# Patient Record
Sex: Female | Born: 1995 | Race: White | Hispanic: No | Marital: Married | Smoking: Never smoker
Health system: Southern US, Community
[De-identification: ages and names within clinical notes are randomized; demographics above are authoritative.]

---

## 2018-12-21 HISTORY — PX: CHOLECYSTECTOMY: SHX55

## 2022-01-02 ENCOUNTER — Other Ambulatory Visit: Payer: Self-pay

## 2022-01-02 ENCOUNTER — Encounter: Payer: Self-pay | Admitting: Emergency Medicine

## 2022-01-02 ENCOUNTER — Ambulatory Visit
Admission: EM | Admit: 2022-01-02 | Discharge: 2022-01-02 | Disposition: A | Discharge status: Home / Self Care | Payer: BC Managed Care – PPO

## 2022-01-02 DIAGNOSIS — J309 Allergic rhinitis, unspecified: Secondary | ICD-10-CM | POA: Diagnosis not present

## 2022-01-02 DIAGNOSIS — J3089 Other allergic rhinitis: Secondary | ICD-10-CM | POA: Diagnosis not present

## 2022-01-02 MED ORDER — PREDNISONE 20 MG PO TABS: 40.0000 mg | ORAL_TABLET | Freq: Every day | ORAL | 0 refills | Status: AC

## 2022-01-02 MED ORDER — LEVOCETIRIZINE DIHYDROCHLORIDE 5 MG PO TABS
5.0000 mg | ORAL_TABLET | Freq: Every evening | ORAL | 5 refills | Status: AC
Start: 2022-01-02 — End: ?

## 2022-01-02 NOTE — ED Provider Notes (Signed)
[facility]    CSN: [id number] Arrival date & time: 01/02/22  1737      History   Chief Complaint No chief complaint on file.   HPI Rose Clark is a 26 y.o. female.   Patient presenting today with 25-month history of nasal congestion, postnasal drip, sometimes blood-streaked mucus coming from her nose.  She denies facial pain or pressure, fever, chills, sore throat, cough, chest pain, shortness of breath.  Taking Sudafed and Flonase nasal spray with mild temporary relief occasionally.  She has known history of seasonal allergies, used to take Allegra but has been off for quite some time and wants a recommendation on a new antihistamine.  No known sick contacts recently.   History reviewed. No pertinent past medical history.  There are no problems to display for this patient.   History reviewed. No pertinent surgical history.  OB History   No obstetric history on file.      Home Medications    Prior to Admission medications   Medication Sig Start Date End Date Taking? Authorizing Provider  fluticasone (FLONASE) 50 MCG/ACT nasal spray Place into both nostrils daily.   Yes [provider]  levocetirizine (XYZAL) 5 MG tablet Take 1 tablet (5 mg total) by mouth every evening. 01/02/22  Yes Volney American, PA-C  predniSONE (DELTASONE) 20 MG tablet Take 2 tablets (40 mg total) by mouth daily with breakfast. 01/02/22  Yes Volney American, PA-C  pseudoephedrine (SUDAFED) 60 MG tablet Take 60 mg by mouth every 4 (four) hours as needed for congestion.   Yes [provider]    Family History History reviewed. No pertinent family history.  Social History Social History   Tobacco Use   Smoking status: Never   Smokeless tobacco: Never  Vaping Use   Vaping Use: Never used  Substance Use Topics   Alcohol use: Never   Drug use: Never     Allergies   Patient has no known allergies.   Review of Systems Review of Systems Per  HPI  Physical Exam Triage Vital Signs ED Triage Vitals  Enc Vitals Group     BP 01/02/22 1858 112/70     Pulse Rate 01/02/22 1858 80     Resp 01/02/22 1858 14     Temp 01/02/22 1858 (!) 97.4 F (36.3 C)     Temp Source 01/02/22 1858 Oral     SpO2 01/02/22 1858 98 %     Weight --      Height --      Head Circumference --      Peak Flow --      Pain Score 01/02/22 1859 0     Pain Loc --      Pain Edu? --      Excl. in [place]? --    No data found.  Updated Vital Signs BP 112/70 (BP Location: Right Arm)    Pulse 80    Temp (!) 97.4 F (36.3 C) (Oral)    Resp 14    LMP 12/04/2021 (Approximate)    SpO2 98%   Visual Acuity Right Eye Distance:   Left Eye Distance:   Bilateral Distance:    Right Eye Near:   Left Eye Near:    Bilateral Near:     Physical Exam Vitals and nursing note reviewed.  Constitutional:      Appearance: Normal appearance.  HENT:     Head: Atraumatic.     Right Ear: Tympanic membrane and external  ear normal.     Left Ear: Tympanic membrane and external ear normal.     Nose: Rhinorrhea present.     Mouth/Throat:     Mouth: Mucous membranes are moist.     Pharynx: Posterior oropharyngeal erythema present.  Eyes:     Extraocular Movements: Extraocular movements intact.     Conjunctiva/sclera: Conjunctivae normal.  Cardiovascular:     Rate and Rhythm: Normal rate and regular rhythm.     Heart sounds: Normal heart sounds.  Pulmonary:     Effort: Pulmonary effort is normal.     Breath sounds: Normal breath sounds. No wheezing or rales.  Musculoskeletal:        General: Normal range of motion.     Cervical back: Normal range of motion and neck supple.  Skin:    General: Skin is warm and dry.  Neurological:     Mental Status: She is alert and oriented to person, place, and time.  Psychiatric:        Mood and Affect: Mood normal.        Thought Content: Thought content normal.     UC Treatments / Results  Labs (all labs ordered are listed, but  only abnormal results are displayed) Labs Reviewed - No data to display  EKG   Radiology No results found.  Procedures Procedures (including critical care time)  Medications Ordered in UC Medications - No data to display  Initial Impression / Assessment and Plan / UC Course  I have reviewed the triage vital signs and the nursing notes.  Pertinent labs & imaging results that were available during my care of the patient were reviewed by me and considered in my medical decision making (see chart for details).     Will treat with prednisone, continue nasal spray, start Xyzal for allergy control.  Discussed supportive over-the-counter medications and home care additionally.  Return for acutely worsening symptoms.  Final Clinical Impressions(s) / UC Diagnoses   Final diagnoses:  Allergic sinusitis  Seasonal allergic rhinitis due to other allergic trigger   Discharge Instructions   None    ED Prescriptions     Medication Sig Dispense Auth. Provider   levocetirizine (XYZAL) 5 MG tablet Take 1 tablet (5 mg total) by mouth every evening. 30 tablet Volney American, [place]   predniSONE (DELTASONE) 20 MG tablet Take 2 tablets (40 mg total) by mouth daily with breakfast. 10 tablet Volney American, [place]      PDMP not reviewed this encounter.   Volney American, [place] 01/02/22 1913

## 2022-01-02 NOTE — ED Triage Notes (Signed)
Nasal Congestion x 2 months.  States some of the congestion has blood mixed in with it.  Has been taking sudafed without relief.

## 2022-02-04 ENCOUNTER — Emergency Department: Payer: BC Managed Care – PPO

## 2022-02-04 ENCOUNTER — Other Ambulatory Visit: Payer: Self-pay

## 2022-02-04 ENCOUNTER — Emergency Department
Admission: EM | Admit: 2022-02-04 | Discharge: 2022-02-04 | Disposition: A | Discharge status: Home / Self Care | Payer: BC Managed Care – PPO | Attending: Student in an Organized Health Care Education/Training Program | Admitting: Student in an Organized Health Care Education/Training Program

## 2022-02-04 DIAGNOSIS — R1011 Right upper quadrant pain: Secondary | ICD-10-CM | POA: Insufficient documentation

## 2022-02-04 DIAGNOSIS — R110 Nausea: Secondary | ICD-10-CM | POA: Insufficient documentation

## 2022-02-04 DIAGNOSIS — R197 Diarrhea, unspecified: Secondary | ICD-10-CM | POA: Insufficient documentation

## 2022-02-04 LAB — COMPREHENSIVE METABOLIC PANEL
ALT: 19 U/L (ref 0–44)
AST: 19 U/L (ref 15–41)
Albumin: 4.2 g/dL (ref 3.5–5.0)
Alkaline Phosphatase: 63 U/L (ref 38–126)
Anion gap: 5 (ref 5–15)
BUN: 14 mg/dL (ref 6–20)
CO2: 23 mmol/L (ref 22–32)
Calcium: 9 mg/dL (ref 8.9–10.3)
Chloride: 108 mmol/L (ref 98–111)
Creatinine, Ser: 0.52 mg/dL (ref 0.44–1.00)
GFR, Estimated: >60 mL/min (ref >60)
Glucose, Bld: 100 mg/dL — ABNORMAL HIGH (ref 70–99)
Potassium: 3.9 mmol/L (ref 3.5–5.1)
Sodium: 136 mmol/L (ref 135–145)
Total Bilirubin: 0.6 mg/dL (ref 0.3–1.2)
Total Protein: 7.3 g/dL (ref 6.5–8.1)

## 2022-02-04 LAB — CBC
HCT: 40.8 % (ref 36.0–46.0)
Hemoglobin: 13.4 g/dL (ref 12.0–15.0)
MCH: 27 pg (ref 26.0–34.0)
MCHC: 32.8 g/dL (ref 30.0–36.0)
MCV: 82.3 fL (ref 80.0–100.0)
Platelets: 309 10*3/uL (ref 150–400)
RBC: 4.96 MIL/uL (ref 3.87–5.11)
RDW: 12.7 % (ref 11.5–15.5)
WBC: 6.6 10*3/uL (ref 4.0–10.5)
nRBC: 0 % (ref 0.0–0.2)

## 2022-02-04 LAB — POC URINE PREG, ED: Preg Test, Ur: NEGATIVE

## 2022-02-04 LAB — URINALYSIS, ROUTINE W REFLEX MICROSCOPIC
Bacteria, UA: NONE SEEN
Bilirubin Urine: NEGATIVE
Glucose, UA: NEGATIVE mg/dL
Ketones, ur: NEGATIVE mg/dL
Leukocytes,Ua: NEGATIVE
Nitrite: NEGATIVE
Protein, ur: NEGATIVE mg/dL
Specific Gravity, Urine: 1.028 (ref 1.005–1.030)
pH: 5 (ref 5.0–8.0)

## 2022-02-04 LAB — LIPASE, BLOOD: Lipase: 37 U/L (ref 11–51)

## 2022-02-04 MED ORDER — DICYCLOMINE HCL 10 MG PO CAPS
10.0000 mg | ORAL_CAPSULE | Freq: Three times a day (TID) | ORAL | 0 refills | Status: AC | PRN
Start: 2022-02-04 — End: ?

## 2022-02-04 NOTE — ED Provider Notes (Signed)
[facility] Provider Note    Event Date/Time   First MD Initiated Contact with Patient 02/04/22 [phone]     (approximate)   History   Abdominal Pain   HPI  Rose Clark is a 26 y.o. female with a history of cholecystectomy performed in [place] roughly 2 years ago presents to the ER for evaluation of intermittent right upper quadrant abdominal pain associate with nausea.  States has been intermittent over the past several month.  This morning had worsening discomfort and wanted to be evaluated denies any fevers.  States that she has had daily loose stools since her surgery.  No abnormal bowel movements.  Denies any shortness of breath no cough or congestion.  No new medications     Physical Exam   Triage Vital Signs: ED Triage Vitals  Enc Vitals Group     BP 02/04/22 0930 (!) 152/101     Pulse Rate 02/04/22 0930 80     Resp 02/04/22 0930 18     Temp 02/04/22 0932 98.4 F (36.9 C)     Temp Source 02/04/22 0932 Oral     SpO2 02/04/22 0930 98 %     Weight 02/04/22 0929 220 lb (99.8 kg)     Height 02/04/22 0929 5\' 4"  (1.626 m)     Head Circumference --      Peak Flow --      Pain Score 02/04/22 0928 8     Pain Loc --      Pain Edu? --      Excl. in [place]? --     Most recent vital signs: Vitals:   02/04/22 0930 02/04/22 0932  BP: (!) 152/101   Pulse: 80   Resp: 18   Temp:  98.4 F (36.9 C)  SpO2: 98%      Constitutional: Alert  Eyes: Conjunctivae are normal.  Head: Atraumatic. Nose: No congestion/rhinnorhea. Mouth/Throat: Mucous membranes are moist.   Neck: Painless ROM.  Cardiovascular:   Good peripheral circulation. Respiratory: Normal respiratory effort.  No retractions.  Gastrointestinal: Soft and nontender in all four quadrants Musculoskeletal:  no deformity Neurologic:  MAE spontaneously. No gross focal neurologic deficits are appreciated.  Skin:  Skin is warm, dry and intact. No rash noted. Psychiatric: Mood and affect are  normal. Speech and behavior are normal.    ED Results / Procedures / Treatments   Labs (all labs ordered are listed, but only abnormal results are displayed) Labs Reviewed  COMPREHENSIVE METABOLIC PANEL - Abnormal; Notable for the following components:      Result Value   Glucose, Bld 100 (*)    All other components within normal limits  URINALYSIS, ROUTINE W REFLEX MICROSCOPIC - Abnormal; Notable for the following components:   Color, Urine YELLOW (*)    APPearance HAZY (*)    Hgb urine dipstick MODERATE (*)    All other components within normal limits  CBC  LIPASE, BLOOD  POC URINE PREG, ED     EKG     RADIOLOGY Please see ED Course for my review and interpretation.  I personally reviewed all radiographic images ordered to evaluate for the above acute complaints and reviewed radiology reports and findings.  These findings were personally discussed with the patient.  Please see medical record for radiology report.    PROCEDURES:  Critical Care performed: No  Procedures   MEDICATIONS ORDERED IN ED: Medications - No data to display   IMPRESSION / MDM / ASSESSMENT AND PLAN /  ED COURSE  I reviewed the triage vital signs and the nursing notes.                              Differential diagnosis includes, but is not limited to, biliary duct stone, colic, gastritis, enteritis, colitis, pna, appendicits, msk strain, pe  Patient presenting with right upper quadrant abdominal pain as described above.  She is clinically very well-appearing afebrile hemodynamically stable.  Blood work was ordered for the above differential she has no white count no biliary obstruction normal LFTs.  Urine with no RBCs.  Her abdominal exam is soft and benign.  No guarding or rebound.  X-ray shows no obstruction no infiltrate.  Does not seem consistent with PE or cardiac etiology she is low risk by Wells criteria and is PERC negative.  Based on her presentation at this time discussed option  for CT imaging versus conservative management and reassessment.  Given low clinical suspicion for appendicitis I think that deferring further imaging at this time is perfectly reasonable and patient is agreeable to plan.  She states that she has wondered if she has IBS in the past therefore will trial short course of Bentyl which has been sent to her pharmacy.  She has PCP follow-up on Monday.  Instructed to return to the ER in 24 hours or sooner if pain worsens.     FINAL CLINICAL IMPRESSION(S) / ED DIAGNOSES   Final diagnoses:  Right upper quadrant abdominal pain     Rx / DC Orders   ED Discharge Orders          Ordered    dicyclomine (BENTYL) 10 MG capsule  Every 8 hours PRN        02/04/22 1117             Note:  This document was prepared using Dragon voice recognition software and may include unintentional dictation errors.    Merlyn Lot, MD 02/04/22 1119

## 2022-02-04 NOTE — Discharge Instructions (Signed)

## 2022-02-04 NOTE — ED Triage Notes (Signed)
Pt comes pov with RUQ pain. Has had gallbladder removed but still has appendix. No fevers. Nausea, diarrhea. No emesis.

## 2022-02-24 ENCOUNTER — Other Ambulatory Visit: Payer: Self-pay | Admitting: Gastroenterology

## 2022-02-24 DIAGNOSIS — R1011 Right upper quadrant pain: Secondary | ICD-10-CM

## 2022-03-05 ENCOUNTER — Other Ambulatory Visit: Payer: Self-pay | Admitting: Gastroenterology

## 2022-03-11 ENCOUNTER — Ambulatory Visit: Payer: BC Managed Care – PPO

## 2022-03-12 ENCOUNTER — Ambulatory Visit
Admission: RE | Admit: 2022-03-12 | Discharge: 2022-03-12 | Disposition: A | Discharge status: Home / Self Care | Payer: BC Managed Care – PPO | Source: Ambulatory Visit | Attending: Gastroenterology | Admitting: Gastroenterology

## 2022-03-12 DIAGNOSIS — R1011 Right upper quadrant pain: Secondary | ICD-10-CM | POA: Diagnosis present

## 2022-05-09 ENCOUNTER — Emergency Department (HOSPITAL_COMMUNITY): Payer: BC Managed Care – PPO

## 2022-05-09 ENCOUNTER — Emergency Department (HOSPITAL_COMMUNITY)
Admission: EM | Admit: 2022-05-09 | Discharge: 2022-05-10 | Disposition: A | Discharge status: Home / Self Care | Payer: BC Managed Care – PPO | Attending: Emergency Medicine | Admitting: Emergency Medicine

## 2022-05-09 ENCOUNTER — Other Ambulatory Visit: Payer: Self-pay

## 2022-05-09 DIAGNOSIS — M25511 Pain in right shoulder: Secondary | ICD-10-CM | POA: Insufficient documentation

## 2022-05-09 DIAGNOSIS — R0602 Shortness of breath: Secondary | ICD-10-CM | POA: Insufficient documentation

## 2022-05-09 DIAGNOSIS — M25512 Pain in left shoulder: Secondary | ICD-10-CM | POA: Insufficient documentation

## 2022-05-09 DIAGNOSIS — R079 Chest pain, unspecified: Secondary | ICD-10-CM | POA: Diagnosis present

## 2022-05-09 NOTE — ED Triage Notes (Signed)
Pt c/o chest pain that has been going on for the last four days. Pt states it has gotten worse and is now having SOB and bilateral shoulder pain.

## 2022-05-10 ENCOUNTER — Encounter (HOSPITAL_COMMUNITY): Payer: Self-pay | Admitting: Emergency Medicine

## 2022-05-10 LAB — BASIC METABOLIC PANEL
Anion gap: 9 (ref 5–15)
BUN: 15 mg/dL (ref 6–20)
CO2: 22 mmol/L (ref 22–32)
Calcium: 9 mg/dL (ref 8.9–10.3)
Chloride: 108 mmol/L (ref 98–111)
Creatinine, Ser: 0.5 mg/dL (ref 0.44–1.00)
GFR, Estimated: >60 mL/min (ref >60)
Glucose, Bld: 96 mg/dL (ref 70–99)
Potassium: 3.6 mmol/L (ref 3.5–5.1)
Sodium: 139 mmol/L (ref 135–145)

## 2022-05-10 LAB — CBC
HCT: 38.1 % (ref 36.0–46.0)
Hemoglobin: 12.7 g/dL (ref 12.0–15.0)
MCH: 27.8 pg (ref 26.0–34.0)
MCHC: 33.3 g/dL (ref 30.0–36.0)
MCV: 83.4 fL (ref 80.0–100.0)
Platelets: 312 10*3/uL (ref 150–400)
RBC: 4.57 MIL/uL (ref 3.87–5.11)
RDW: 12.4 % (ref 11.5–15.5)
WBC: 10.9 10*3/uL — ABNORMAL HIGH (ref 4.0–10.5)
nRBC: 0 % (ref 0.0–0.2)

## 2022-05-10 LAB — TROPONIN I (HIGH SENSITIVITY)
Troponin I (High Sensitivity): 2 ng/L (ref <18)
Troponin I (High Sensitivity): <2 ng/L (ref <18)

## 2022-05-10 LAB — D-DIMER, QUANTITATIVE: D-Dimer, Quant: <0.27 ug/mL-FEU (ref 0.00–0.50)

## 2022-05-10 LAB — POC URINE PREG, ED: Preg Test, Ur: NEGATIVE

## 2022-05-10 NOTE — ED Provider Notes (Signed)
[facility] EMERGENCY DEPARTMENT Provider Note   CSN: [id number] Arrival date & time: 05/09/22  2244     History  Chief Complaint  Patient presents with   Chest Pain    Rose Clark is a 26 y.o. female.  Patient presents for evaluation of chest pain.  Patient reports that the pain began 4 days ago.  Initially it was intermittent pain in the center of her chest and off to the right.  She then started having pain in both of her shoulders.  Tonight the pain became more constant and she is feeling short of breath.      Home Medications Prior to Admission medications   Medication Sig Start Date End Date Taking? Authorizing Provider  dicyclomine (BENTYL) 10 MG capsule Take 1 capsule (10 mg total) by mouth every 8 (eight) hours as needed for up to 10 doses for spasms. 02/04/22   Merlyn Lot, MD  fluticasone [facility]) 50 MCG/ACT nasal spray Place into both nostrils daily.    [provider]  levocetirizine (XYZAL) 5 MG tablet Take 1 tablet (5 mg total) by mouth every evening. 01/02/22   Volney American, PA-C  predniSONE (DELTASONE) 20 MG tablet Take 2 tablets (40 mg total) by mouth daily with breakfast. 01/02/22   Volney American, PA-C  pseudoephedrine (SUDAFED) 60 MG tablet Take 60 mg by mouth every 4 (four) hours as needed for congestion.    [provider]      Allergies    Baclofen    Review of Systems   Review of Systems  Physical Exam Updated Vital Signs BP 124/70 (BP Location: Right Arm)   Pulse 93   Temp 98 F (36.7 C) (Oral)   Resp 18   Ht 5\' 4"  (1.626 m)   Wt 105.8 kg   LMP 05/06/2022 (Approximate)   SpO2 100%   BMI 40.03 kg/m  Physical Exam Vitals and nursing note reviewed.  Constitutional:      General: She is not in acute distress.    Appearance: She is well-developed.  HENT:     Head: Normocephalic and atraumatic.     Mouth/Throat:     Mouth: Mucous membranes are moist.  Eyes:     General: Vision grossly intact.  Gaze aligned appropriately.     Extraocular Movements: Extraocular movements intact.     Conjunctiva/sclera: Conjunctivae normal.  Cardiovascular:     Rate and Rhythm: Normal rate and regular rhythm.     Pulses: Normal pulses.     Heart sounds: Normal heart sounds, S1 normal and S2 normal. No murmur heard.   No friction rub. No gallop.  Pulmonary:     Effort: Pulmonary effort is normal. No respiratory distress.     Breath sounds: Normal breath sounds.  Abdominal:     General: Bowel sounds are normal.     Palpations: Abdomen is soft.     Tenderness: There is no abdominal tenderness. There is no guarding or rebound.     Hernia: No hernia is present.  Musculoskeletal:        General: No swelling.     Cervical back: Full passive range of motion without pain, normal range of motion and neck supple. No spinous process tenderness or muscular tenderness. Normal range of motion.     Right lower leg: No edema.     Left lower leg: No edema.  Skin:    General: Skin is warm and dry.     Capillary Refill: Capillary refill takes less  than 2 seconds.     Findings: No ecchymosis, erythema, rash or wound.  Neurological:     General: No focal deficit present.     Mental Status: She is alert and oriented to person, place, and time.     GCS: GCS eye subscore is 4. GCS verbal subscore is 5. GCS motor subscore is 6.     Cranial Nerves: Cranial nerves 2-12 are intact.     Sensory: Sensation is intact.     Motor: Motor function is intact.     Coordination: Coordination is intact.  Psychiatric:        Attention and Perception: Attention normal.        Mood and Affect: Mood normal.        Speech: Speech normal.        Behavior: Behavior normal.    ED Results / Procedures / Treatments   Labs (all labs ordered are listed, but only abnormal results are displayed) Labs Reviewed  CBC - Abnormal; Notable for the following components:      Result Value   WBC 10.9 (*)    All other components within  normal limits  BASIC METABOLIC PANEL  D-DIMER, QUANTITATIVE  POC URINE PREG, ED  TROPONIN I (HIGH SENSITIVITY)  TROPONIN I (HIGH SENSITIVITY)    EKG EKG Interpretation  Date/Time:  Saturday May 09 2022 22:56:11 EDT Ventricular Rate:  94 PR Interval:  130 QRS Duration: 88 QT Interval:  370 QTC Calculation: 462 R Axis:   16 Text Interpretation: Normal sinus rhythm with sinus arrhythmia Normal ECG No previous ECGs available Confirmed by Daleen Bo [phone]) on 05/09/2022 11:12:24 PM  Radiology DG Chest 2 View  Result Date: 05/09/2022 CLINICAL DATA:  Chest pain. EXAM: CHEST - 2 VIEW COMPARISON:  Radiograph 02/04/2022 FINDINGS: The cardiomediastinal contours are normal. The lungs are clear. Pulmonary vasculature is normal. No consolidation, pleural effusion, or pneumothorax. No acute osseous abnormalities are seen. IMPRESSION: Negative radiographs of the chest. Electronically Signed   By: Keith Rake M.D.   On: 05/09/2022 23:12    Procedures Procedures    Medications Ordered in ED Medications - No data to display  ED Course/ Medical Decision Making/ A&P                           Medical Decision Making Problems Addressed: Chest pain, unspecified type: acute illness or injury  Amount and/or Complexity of Data Reviewed Labs: ordered. Radiology: ordered.   Patient with minimal cardiac risk factors (increased BMI) presents to the emergency department for evaluation of chest pain.  Symptoms ongoing intermittently for several days and then continuously tonight.  She has had some pain in her shoulders and shortness of breath.  She has had a previous cholecystectomy and has no abdominal complaints, has a benign abdominal exam.  Patient's EKG is unremarkable.  Chest x-ray without pathology.  Lungs are clear on auscultation, no bronchospasm.  Oxygenation is normal.  No tachycardia.  No PE risk factors.  Negative D-dimer.  This essentially rules out PE.  Patient had serial  troponins that are negative.  In the setting of extremely low cardiac risk, this is felt to rule out cardiac etiology as well.  Etiology of her symptoms is unclear at this time but she does not require hospitalization or further emergency work-up.        Final Clinical Impression(s) / ED Diagnoses Final diagnoses:  Chest pain, unspecified type    Rx /  DC Orders ED Discharge Orders     None         Jonell Krontz, Gwenyth Allegra, MD 05/10/22 [phone]

## 2022-11-03 IMAGING — US US ABDOMEN LIMITED
1 series · 14 of 25 positions shown · non-contrast
Comparison: None.

CLINICAL DATA: Right upper quadrant pain

EXAM:
ULTRASOUND ABDOMEN LIMITED RIGHT UPPER QUADRANT

[Series 1: general · 14 of 41 slices shown]
[im 1/41]
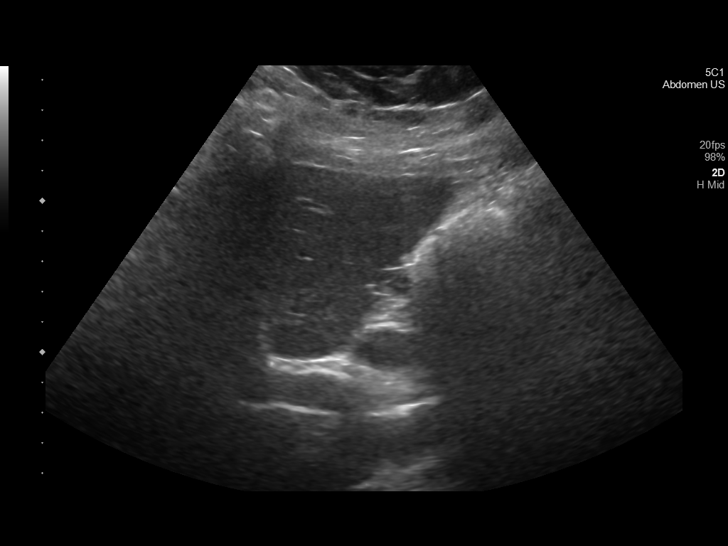
[im 4/41]
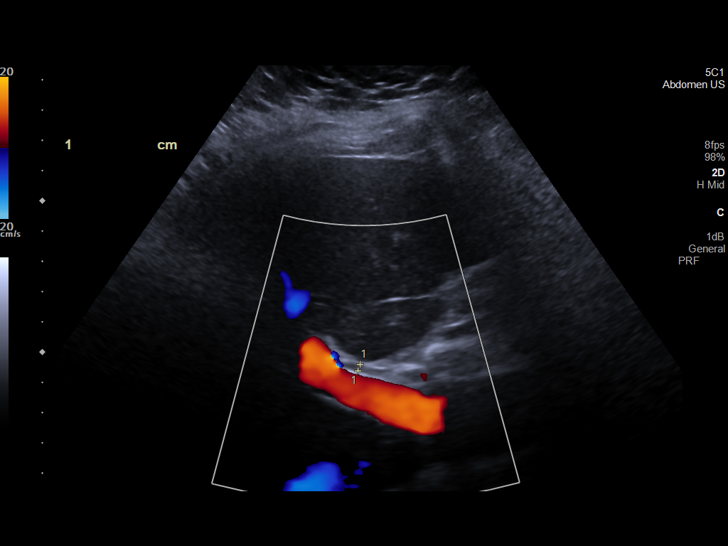
[im 7/41]
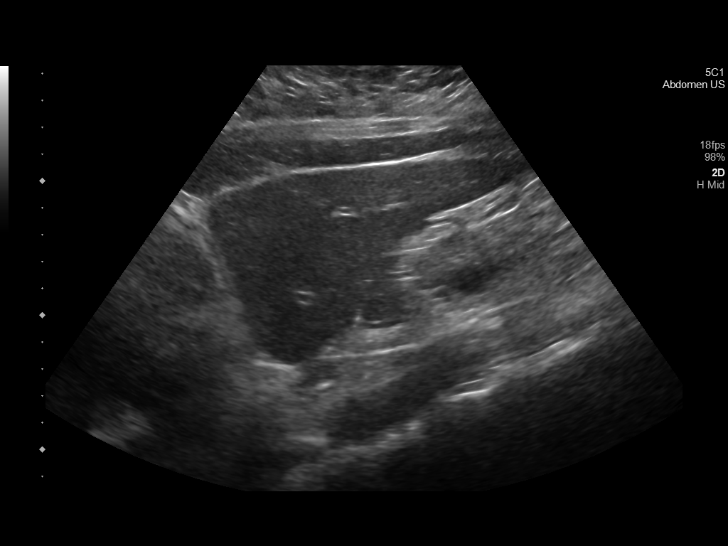
[im 11/41]
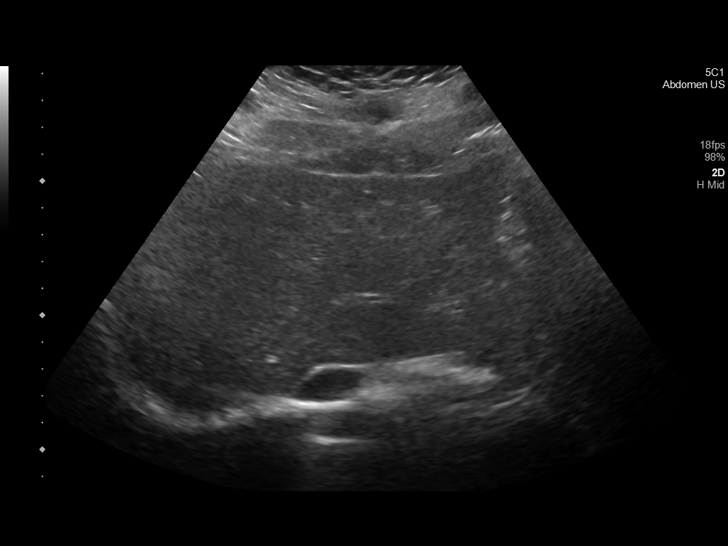
[im 14/41]
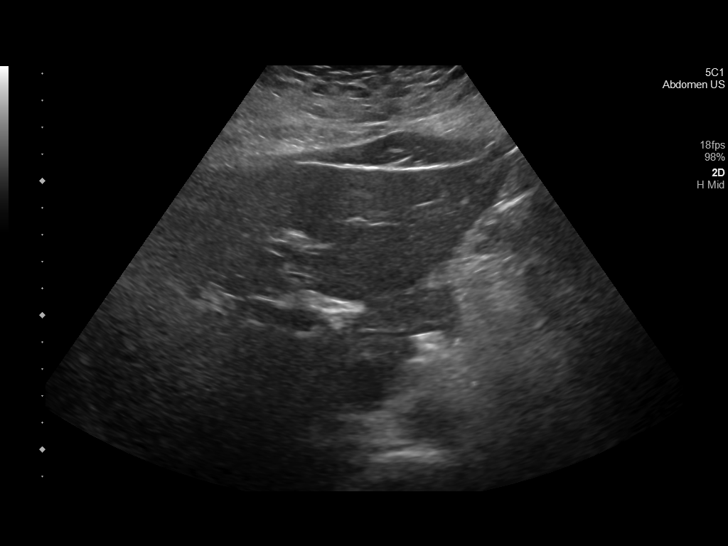
[im 16/41]
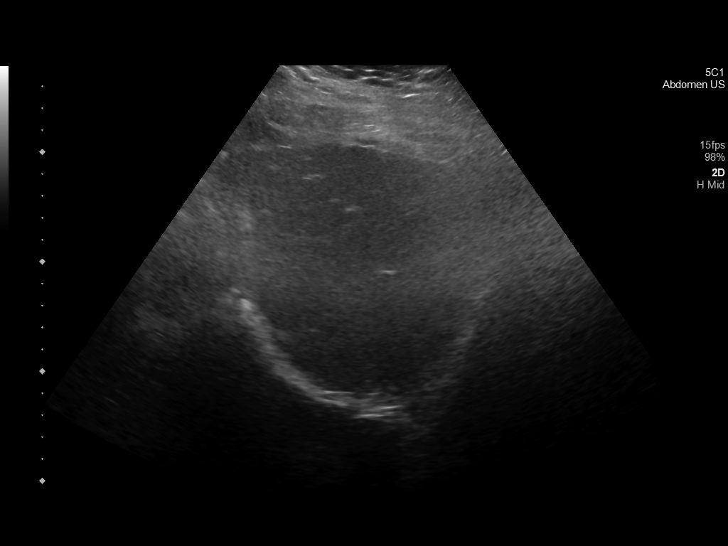
[im 19/41]
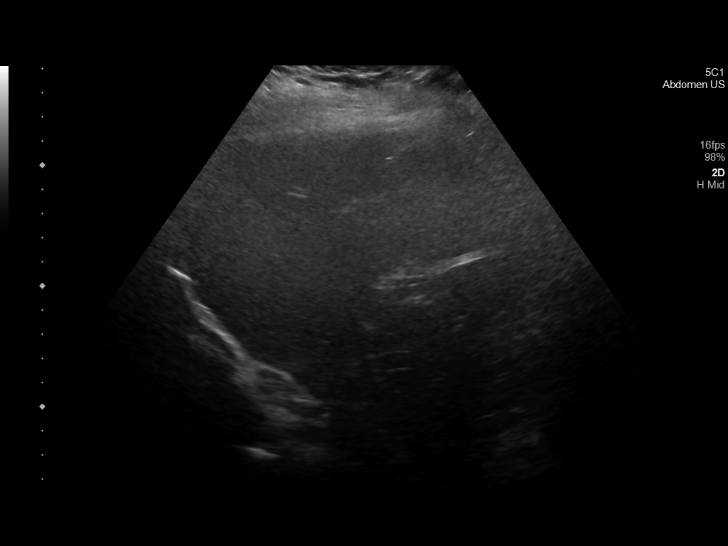
[im 22/41]
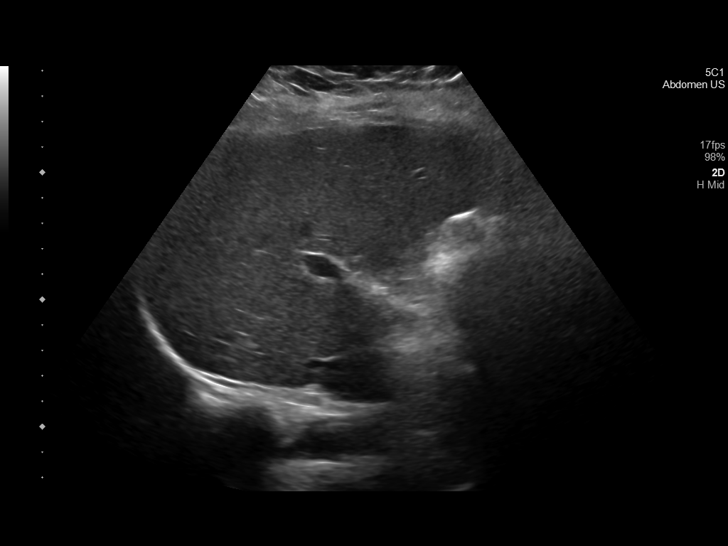
[im 26/41]
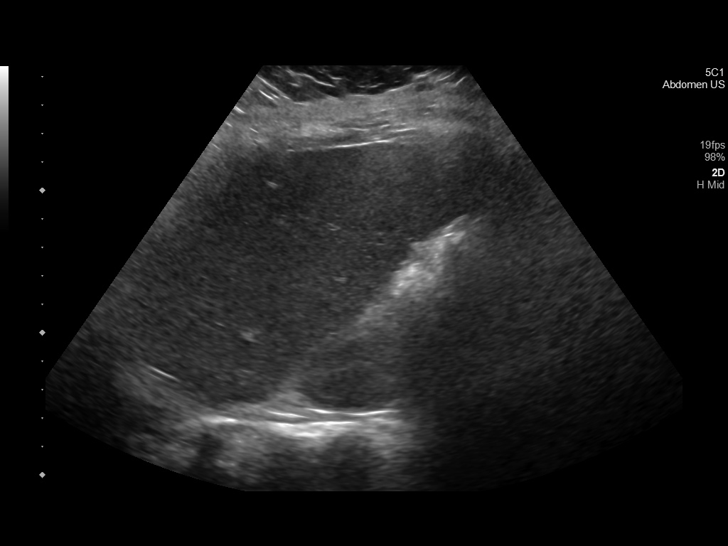
[im 27/41]
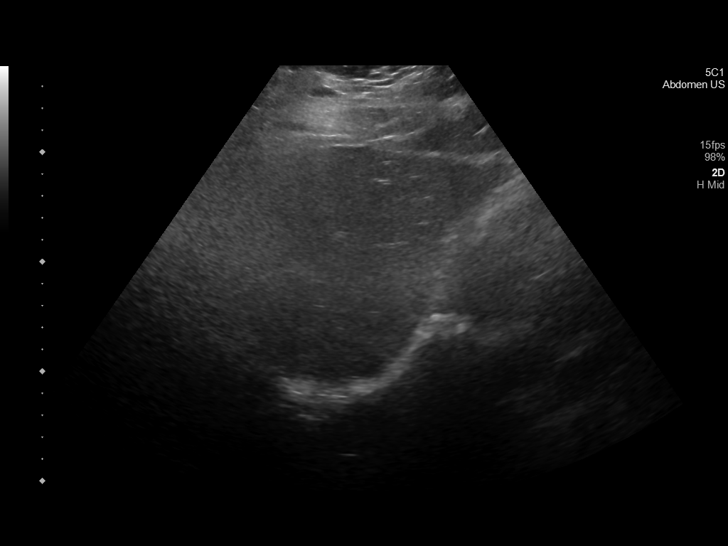
[im 31/41]
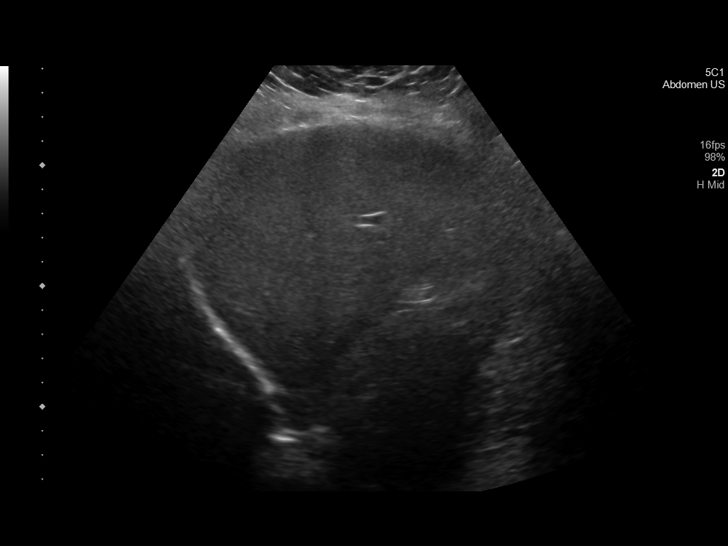
[im 34/41]
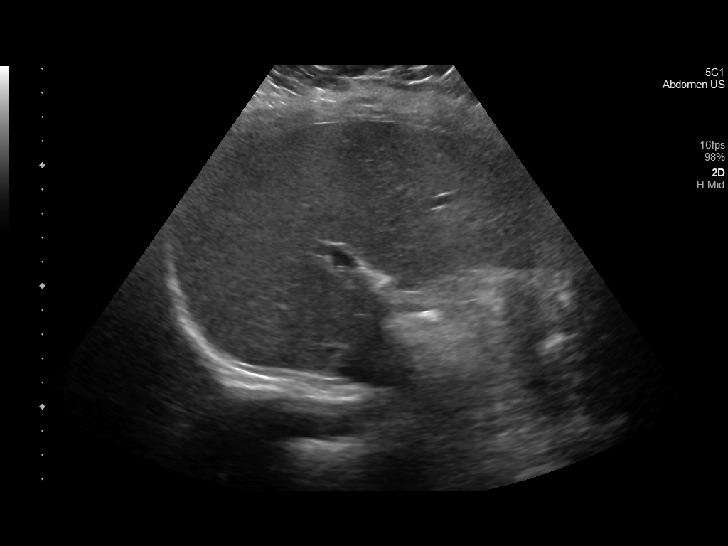
[im 37/41]
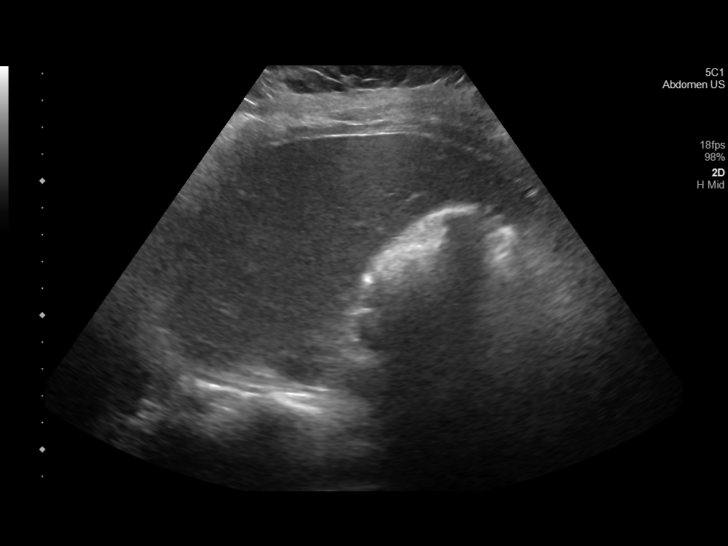
[im 41/41]
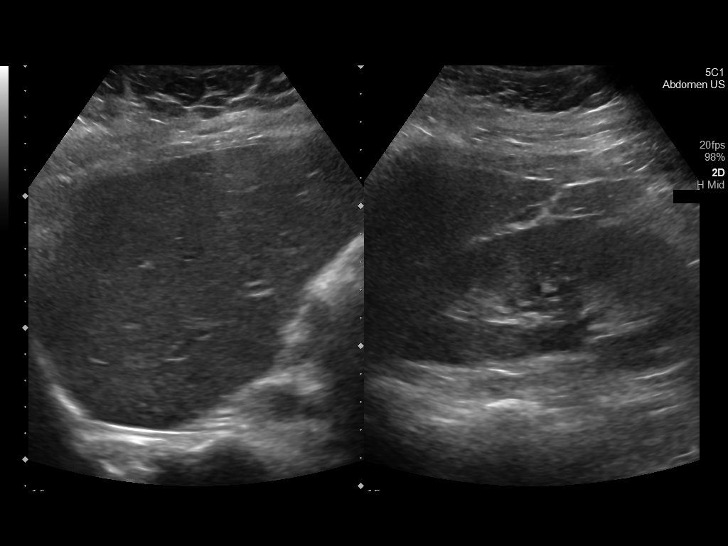

[14 of 25 positions shown; findings below may reference images not displayed]

FINDINGS: Gallbladder:

Surgically absent

Common bile duct:

Diameter: 2.2 mm

Liver:

No focal lesion identified. Within normal limits in parenchymal
echogenicity. Portal vein is patent on color Doppler imaging with
normal direction of blood flow towards the liver.

Other: None.
IMPRESSION: Status post cholecystectomy.  No acute abnormality noted.

## 2022-11-27 ENCOUNTER — Other Ambulatory Visit: Payer: Self-pay | Admitting: Ophthalmology

## 2022-11-27 DIAGNOSIS — H4710 Unspecified papilledema: Secondary | ICD-10-CM

## 2022-11-30 ENCOUNTER — Ambulatory Visit
Admission: RE | Admit: 2022-11-30 | Discharge: 2022-11-30 | Disposition: A | Discharge status: Home / Self Care | Payer: BC Managed Care – PPO | Source: Ambulatory Visit | Attending: Ophthalmology | Admitting: Ophthalmology

## 2022-11-30 DIAGNOSIS — H4710 Unspecified papilledema: Secondary | ICD-10-CM | POA: Diagnosis not present

## 2022-11-30 MED ORDER — GADOBUTROL 1 MMOL/ML IV SOLN
10.0000 mL | Freq: Once | INTRAVENOUS | Status: AC | PRN
  Administered 2022-11-30: 10 mL via INTRAVENOUS

## 2022-12-31 IMAGING — DX DG CHEST 2V
2 series · 2 of 2 positions shown · non-contrast
Comparison: Radiograph 02/04/2022

CLINICAL DATA: Chest pain.

EXAM:
CHEST - 2 VIEW

[chest pa]
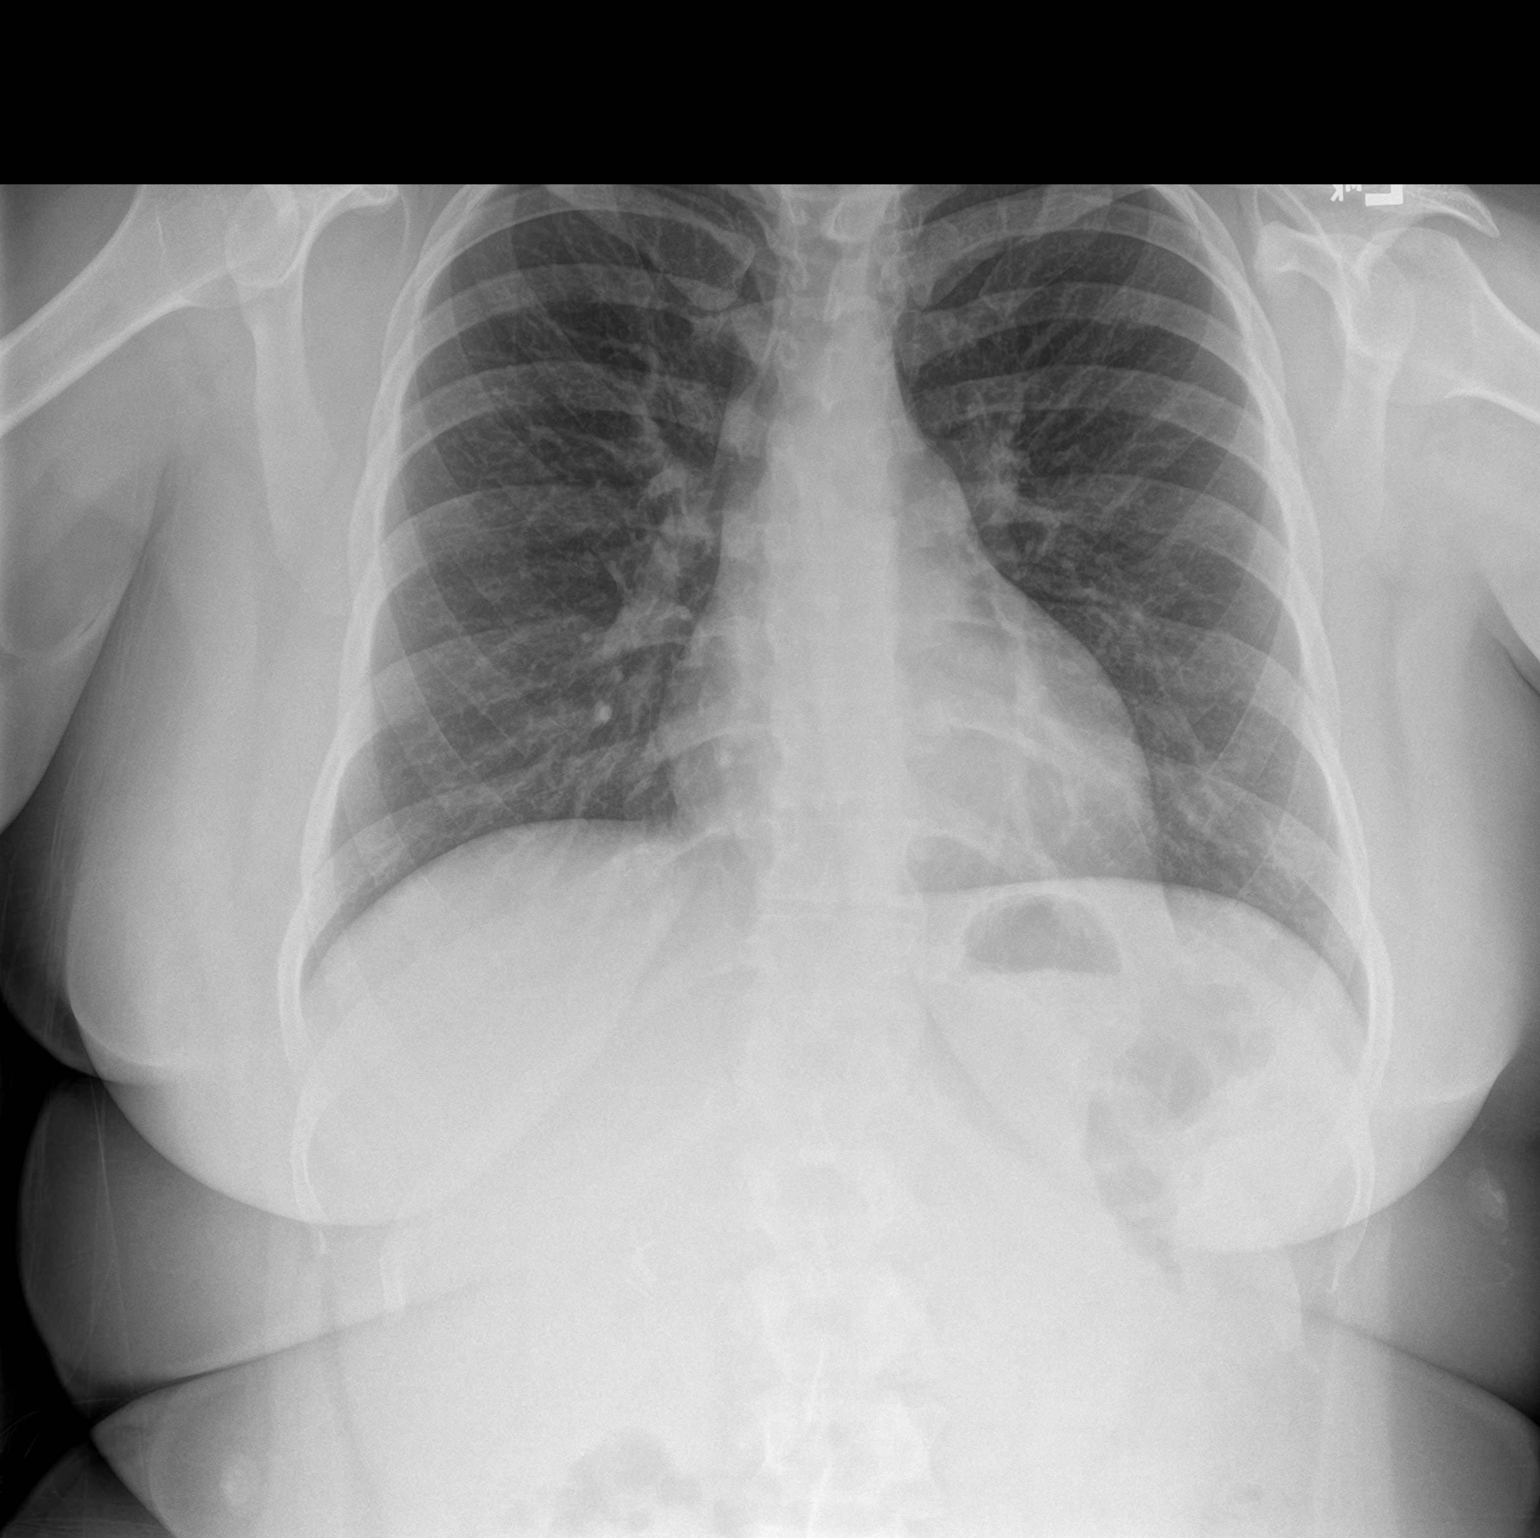

[chest lat]
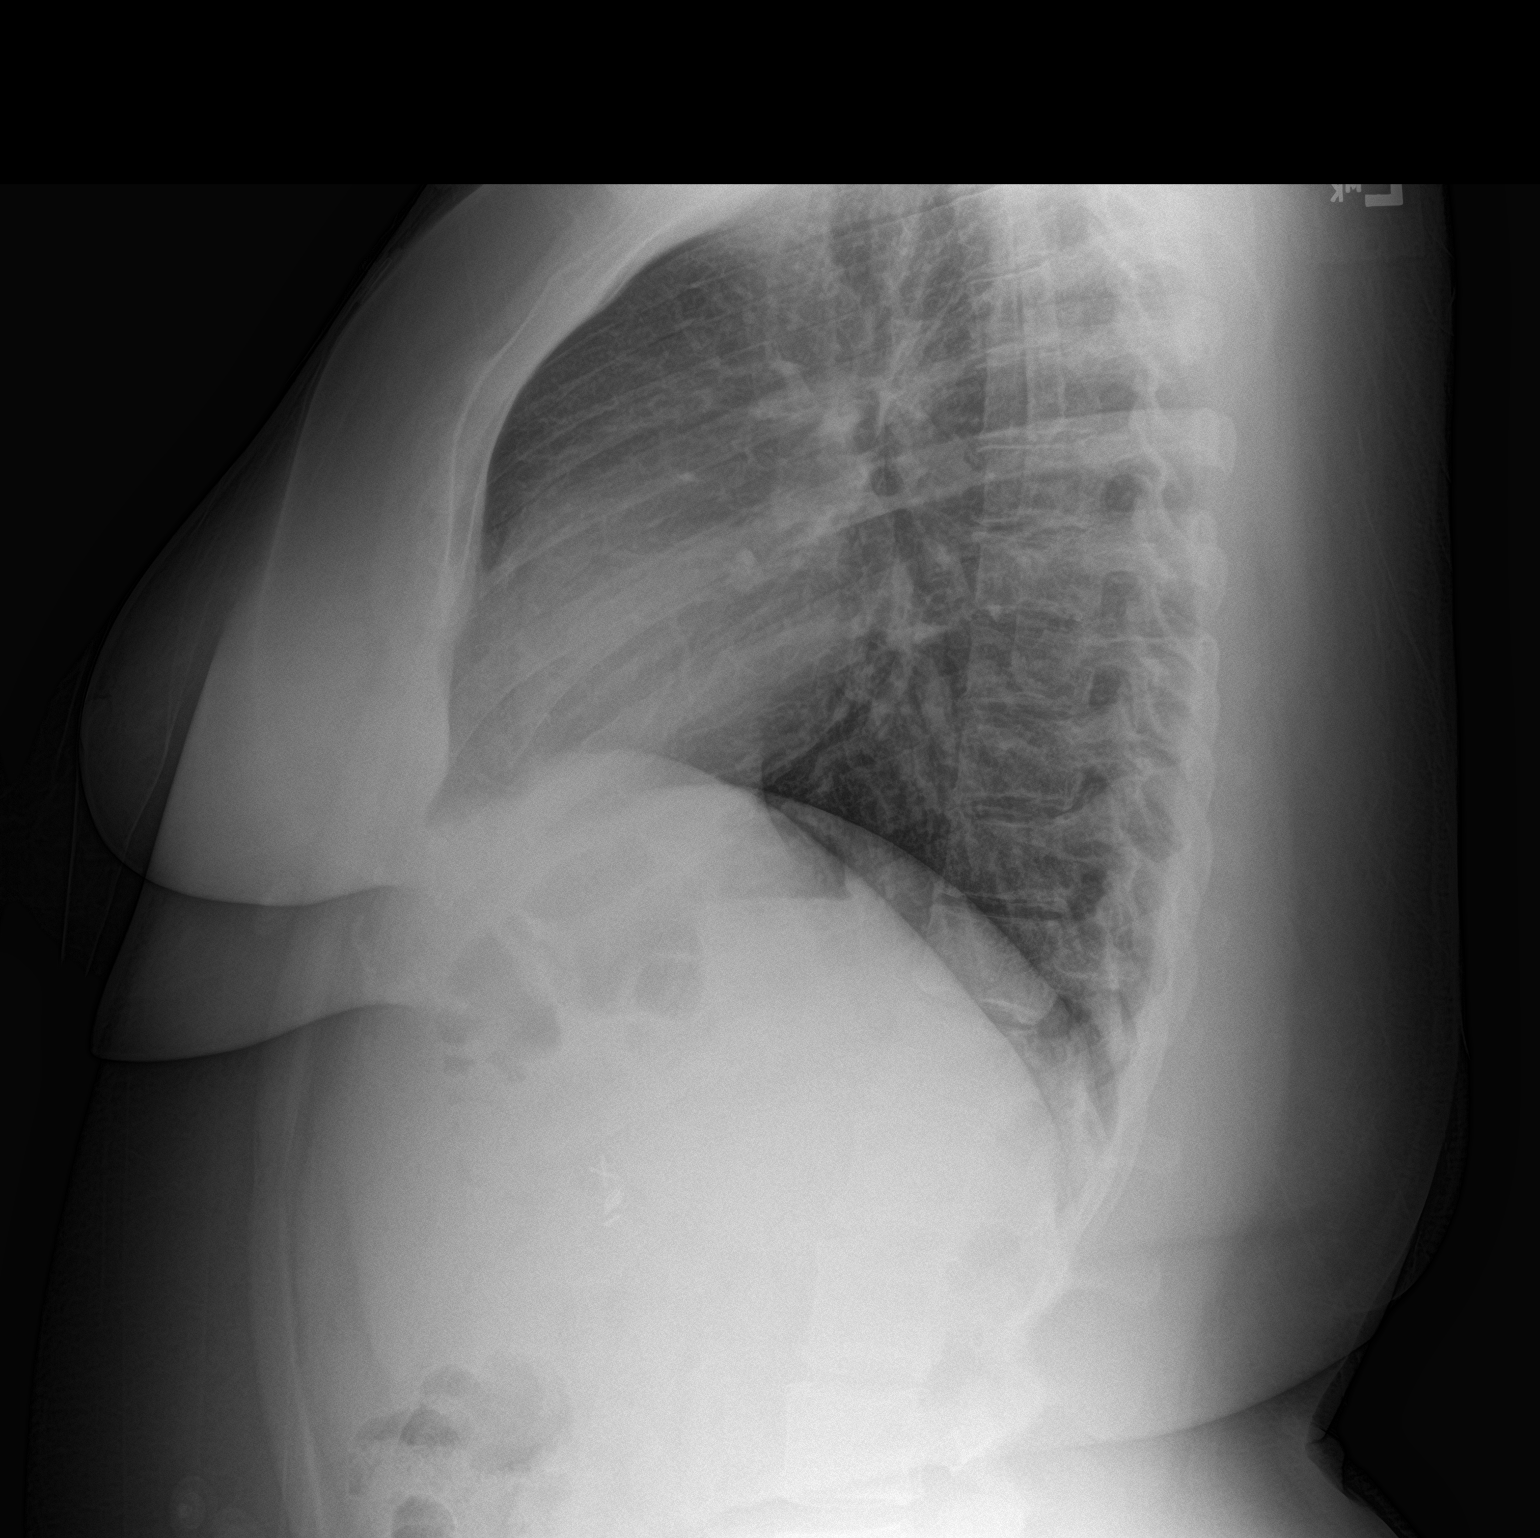

[2 of 2 positions shown; findings below may reference images not displayed]

FINDINGS: The cardiomediastinal contours are normal. The lungs are clear.
Pulmonary vasculature is normal. No consolidation, pleural effusion,
or pneumothorax. No acute osseous abnormalities are seen.
IMPRESSION: Negative radiographs of the chest.

## 2024-10-26 ENCOUNTER — Encounter: Payer: Self-pay | Admitting: Neurosurgery

## 2024-10-26 NOTE — Telephone Encounter (Signed)
 I called to ask the patient   Her headaches started  last week and neck pain for the past two weeks.   Neck pain at the base of the skull on the left side. She endorses lack of tolerance of turning to the side.   8/10, Patient is taking ibuprofen but not helping as much.   Eyes are more pressurized but does not endorse any changes in her vision.

## 2024-10-26 NOTE — Telephone Encounter (Signed)
 She wants to know if Dr. Rosslyn wants any new imaging prior to her appointment with him tomorrow.

## 2024-10-27 ENCOUNTER — Other Ambulatory Visit: Payer: Self-pay

## 2024-10-27 ENCOUNTER — Ambulatory Visit
Admission: RE | Admit: 2024-10-27 | Discharge: 2024-10-27 | Disposition: A | Discharge status: Home / Self Care | Payer: Self-pay | Source: Ambulatory Visit | Attending: Neurosurgery | Admitting: Neurosurgery

## 2024-10-27 ENCOUNTER — Telehealth: Admitting: Neurosurgery

## 2024-10-27 ENCOUNTER — Ambulatory Visit: Admitting: Neurosurgery

## 2024-10-27 ENCOUNTER — Encounter: Payer: Self-pay | Admitting: Neurosurgery

## 2024-10-27 ENCOUNTER — Telehealth: Payer: Self-pay | Admitting: Neurosurgery

## 2024-10-27 VITALS — BP 129/84 | HR 95 | Temp 97.9°F | Ht 64.0 in | Wt 250.4 lb

## 2024-10-27 DIAGNOSIS — Z049 Encounter for examination and observation for unspecified reason: Secondary | ICD-10-CM

## 2024-10-27 DIAGNOSIS — G932 Benign intracranial hypertension: Secondary | ICD-10-CM

## 2024-10-27 DIAGNOSIS — Z95828 Presence of other vascular implants and grafts: Secondary | ICD-10-CM

## 2024-10-27 NOTE — Patient Instructions (Signed)
 Date: 10/27/2024   Patient: Rose Clark  Patient DOB: Apr 09, 1996  Provider:  Dino Sable MD   [name] was seen in our office on 10/27/2024.  Please contact our office with any questions/concerns at [phone].   Tinnie Raelene Trew RN-BSN [facility]  Neurosurgery  Clinic RN

## 2024-10-27 NOTE — Progress Notes (Signed)
 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  She was doing very well until recently when her headaches started back up again.  She states that they are almost the level they were prior to the stent placement.  She had to stop her Topamax because they are trying to conceive.  She has tried Diamox in the past and got 250 mg but says that it did not really help.  Had a lengthy conversation with her and gave her a couple of options: First option would be is to try a escalating dose of Diamox. Second option would be is to get a lumbar puncture Third option would be is to get a CT venogram and last option would be is to proceed with a right sided ventricular peritoneal shunt or a ventriculoatrial shunt.  We talked in great detail about all these options.  She decided that she wants to get a lumbar puncture and check the pressure first.  I will also make a referral to bariatrics clinic for her to be evaluated for treatment.  She thinks that she may have POTS syndrome and weight loss may be a difficult journey.  To that end, getting some professional help would be really helpful in my opinion.  I will see her back after the LP.

## 2024-11-13 NOTE — Discharge Instructions (Signed)

## 2024-11-14 ENCOUNTER — Ambulatory Visit
Admission: RE | Admit: 2024-11-14 | Discharge: 2024-11-14 | Disposition: A | Discharge status: Home / Self Care | Source: Ambulatory Visit | Attending: Neurosurgery | Admitting: Neurosurgery

## 2024-11-14 DIAGNOSIS — G932 Benign intracranial hypertension: Secondary | ICD-10-CM

## 2024-11-17 ENCOUNTER — Encounter: Payer: Self-pay | Admitting: Neurosurgery

## 2024-11-20 NOTE — Telephone Encounter (Signed)
 Patient states that she does have headache but it is not as bad as Friday .   Pressure in the head and face on Friday evening and has been resting most of the weekend.   Her headaches were about 10/10  especially if she coughs but at times a 2 /10  when just sitting.   Her headache is better when she puts pressure on her forehead.   She states that she has some nausea but no vomiting.

## 2024-11-22 NOTE — Telephone Encounter (Signed)
 Called patient and scheduled for video visit next week

## 2024-11-28 ENCOUNTER — Telehealth: Admitting: Neurosurgery

## 2024-11-28 DIAGNOSIS — G932 Benign intracranial hypertension: Secondary | ICD-10-CM

## 2024-11-28 MED ORDER — ACETAZOLAMIDE 250 MG PO TABS: 250.0000 mg | ORAL_TABLET | Freq: Three times a day (TID) | ORAL | 3 refills | Status: AC

## 2024-11-28 NOTE — Progress Notes (Signed)
 Virtual Visit via Telephone Note  I connected with Rose Clark on 11/28/24 at  5:20 PM EST by telephone and verified that I am speaking with the correct person using two identifiers.  Location: Patient: Car Provider: Clinic   I discussed the limitations, risks, security and privacy concerns of performing an evaluation and management service by telephone and the availability of in person appointments. I also discussed with the patient that there may be a patient responsible charge related to this service. The patient expressed understanding and agreed to proceed.   28 year old lady with idiopathic intracranial hypertension and a sinus stent.  She developed headaches again and we talked about the options.  She had a lumbar puncture done which demonstrated opening pressure of 25 cm.  She may have had a post spinal headache after that and finally feels much better.  We talked about the options and she wants to try Diamox  again.  To that end, I prescribed 250 mg twice daily and I will talk to her again in a month    I discussed the assessment and treatment plan with the patient. The patient was provided an opportunity to ask questions and all were answered. The patient agreed with the plan and demonstrated an understanding of the instructions.   The patient was advised to call back or seek an in-person evaluation if the symptoms worsen or if the condition fails to improve as anticipated.  I provided 10 minutes of non-face-to-face time during this encounter.   Melaine Mcphee, MD

## 2024-12-28 ENCOUNTER — Institutional Professional Consult (permissible substitution): Admitting: Family Medicine

## 2024-12-29 ENCOUNTER — Telehealth: Admitting: Neurosurgery

## 2025-01-05 ENCOUNTER — Telehealth: Admitting: Neurosurgery

## 2025-01-05 DIAGNOSIS — G932 Benign intracranial hypertension: Secondary | ICD-10-CM | POA: Diagnosis not present

## 2025-01-05 DIAGNOSIS — Z95828 Presence of other vascular implants and grafts: Secondary | ICD-10-CM

## 2025-01-05 NOTE — Progress Notes (Signed)
 Virtual Visit via Video Note  I connected with Rose Clark on 01/05/25 at  3:00 PM EST by a video enabled telemedicine application and verified that I am speaking with the correct person using two identifiers.  Location: Patient: Car Provider: Clinic   I discussed the limitations of evaluation and management by telemedicine and the availability of in person appointments. The patient expressed understanding and agreed to proceed.  29 year old lady with idiopathic intracranial hypertension.  Patient had a stent placed but due to recurrence of her symptoms we did a repeat lumbar puncture which demonstrated opening pressure of 25 cm.  Patient opted to have medical management and we started her on Diamox  250 mg twice a day.  She states that she feels much better but admits to forgetting the morning dose occasionally.  She had some questions regarding this and I told her that it is completely fine to take 2 at night.  I gave her some tips to set a reminder for taking it during the daytime.  Given the fact that she is doing so well and has already talked to her primary care doctor about having her kidneys checked twice a year, we decided to see each other back by video visit in 3 months.    I discussed the assessment and treatment plan with the patient. The patient was provided an opportunity to ask questions and all were answered. The patient agreed with the plan and demonstrated an understanding of the instructions.   The patient was advised to call back or seek an in-person evaluation if the symptoms worsen or if the condition fails to improve as anticipated.  I provided 10 minutes of non-face-to-face time during this encounter.   Yennifer Segovia, MD

## 2025-02-06 ENCOUNTER — Institutional Professional Consult (permissible substitution): Admitting: Family Medicine

## 2025-04-06 ENCOUNTER — Telehealth: Admitting: Neurosurgery
